# Patient Record
Sex: Male | Born: 1995 | Race: Asian | Hispanic: No | Marital: Single | Smoking: Former smoker
Health system: Southern US, Community
[De-identification: ages and names within clinical notes are randomized; demographics above are authoritative.]

## PROBLEM LIST (undated history)

## (undated) DIAGNOSIS — I099 Rheumatic heart disease, unspecified: Secondary | ICD-10-CM

## (undated) DIAGNOSIS — Z952 Presence of prosthetic heart valve: Secondary | ICD-10-CM

## (undated) HISTORY — DX: Presence of prosthetic heart valve: Z95.2

## (undated) HISTORY — DX: Rheumatic heart disease, unspecified: I09.9

---
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
## 2014-06-28 ENCOUNTER — Ambulatory Visit
Admission: RE | Admit: 2014-06-28 | Discharge: 2014-06-28 | Disposition: A | Discharge status: Home / Self Care | Payer: Medicaid Other | Source: Ambulatory Visit | Attending: Pediatrics | Admitting: Pediatrics

## 2014-06-28 ENCOUNTER — Other Ambulatory Visit: Payer: Self-pay | Admitting: Pediatrics

## 2014-06-28 DIAGNOSIS — M549 Dorsalgia, unspecified: Secondary | ICD-10-CM

## 2015-01-04 IMAGING — CR DG THORACIC SPINE 3V
3 series · 3 of 3 positions shown · non-contrast
Comparison: 04/10/2012

CLINICAL DATA: Non trauma, initial encounter for pain. Acute and
intermittent pain in the upper and mid thoracic spine for 2 weeks.
Patient occasionally lobes waves.

EXAM:
THORACIC SPINE - 2 VIEW + SWIMMERS

[view not recorded (1 of 3)]
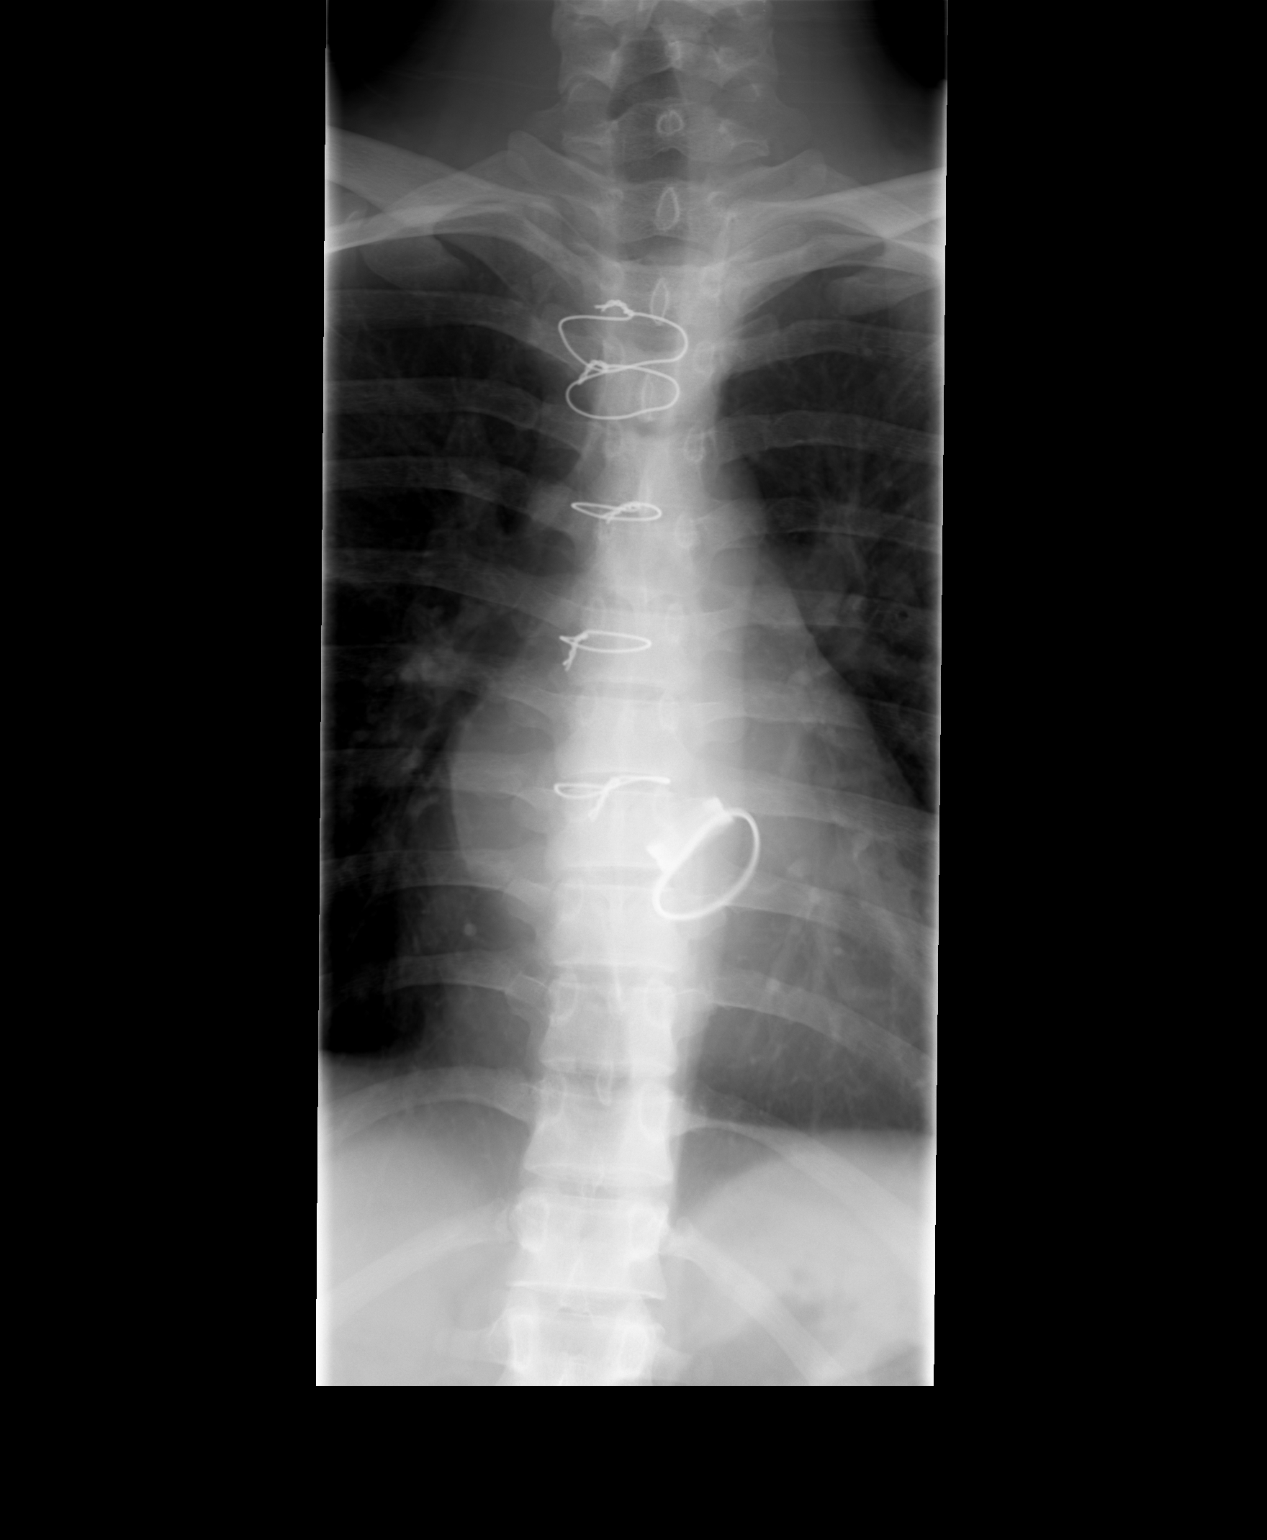

[view not recorded (2 of 3)]
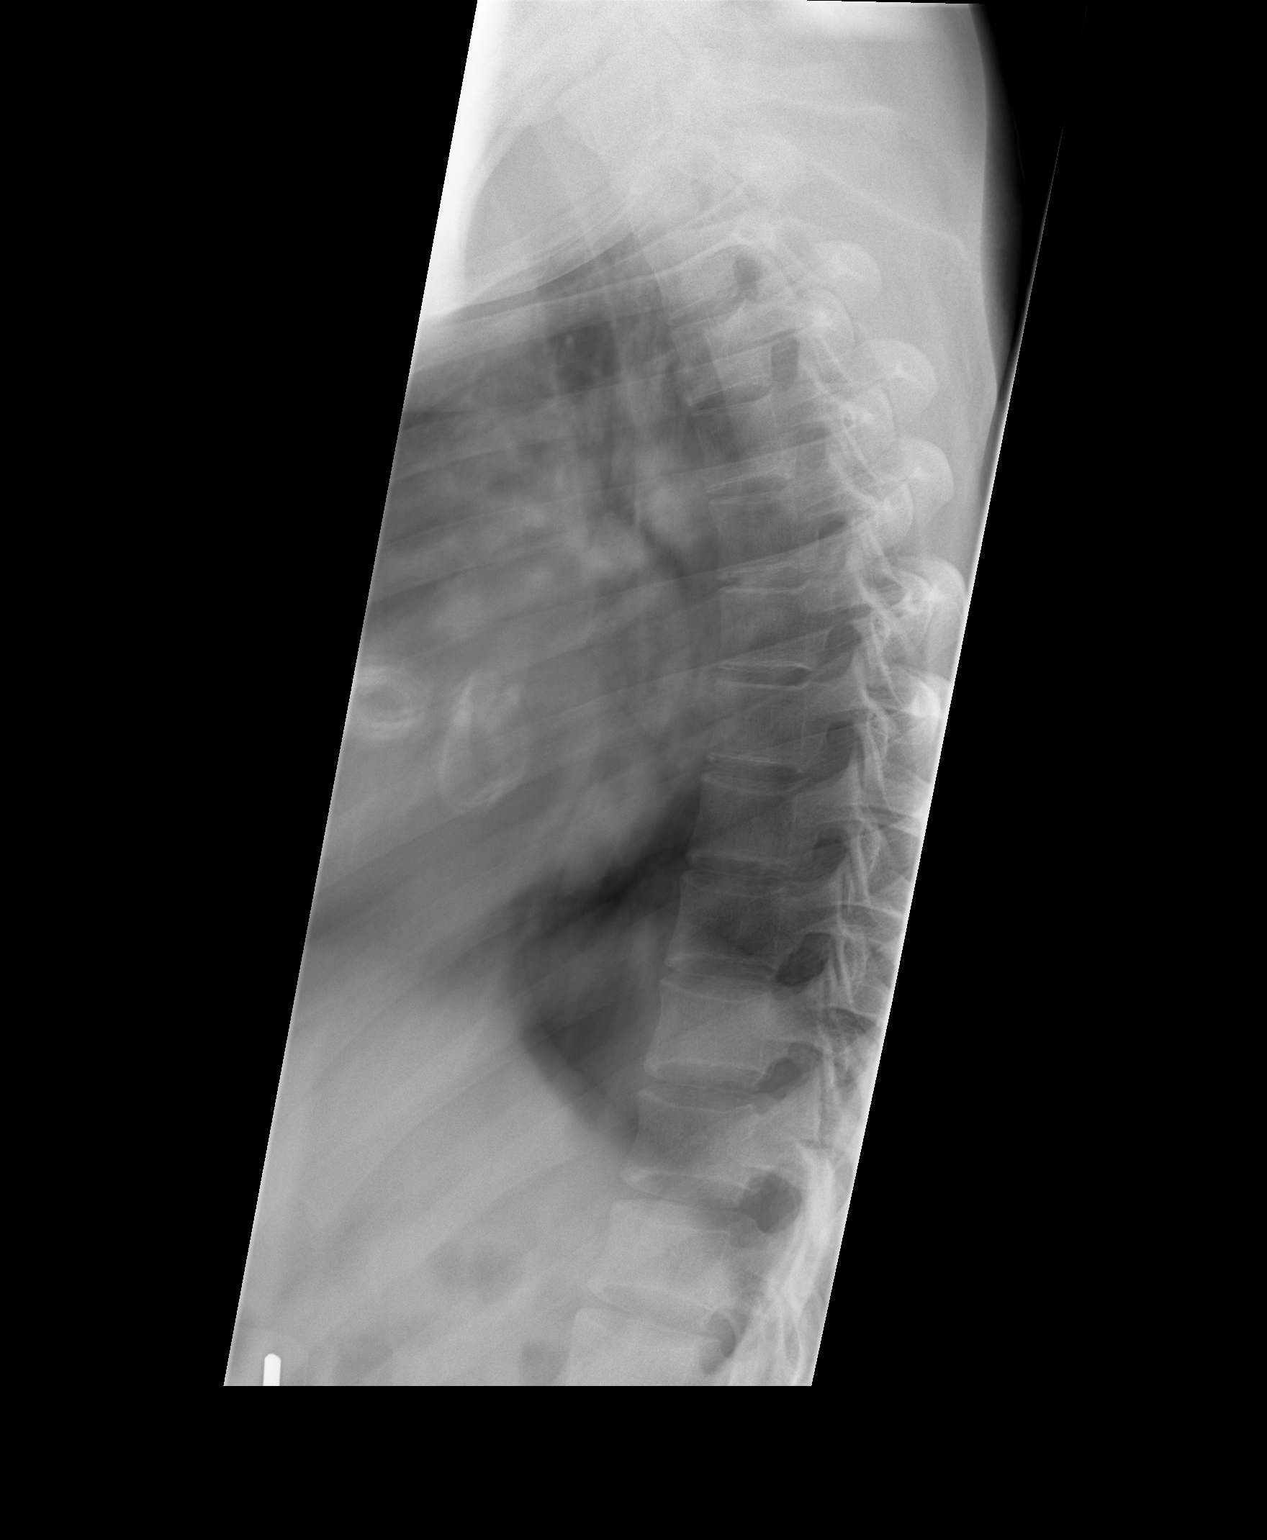

[view not recorded (3 of 3)]
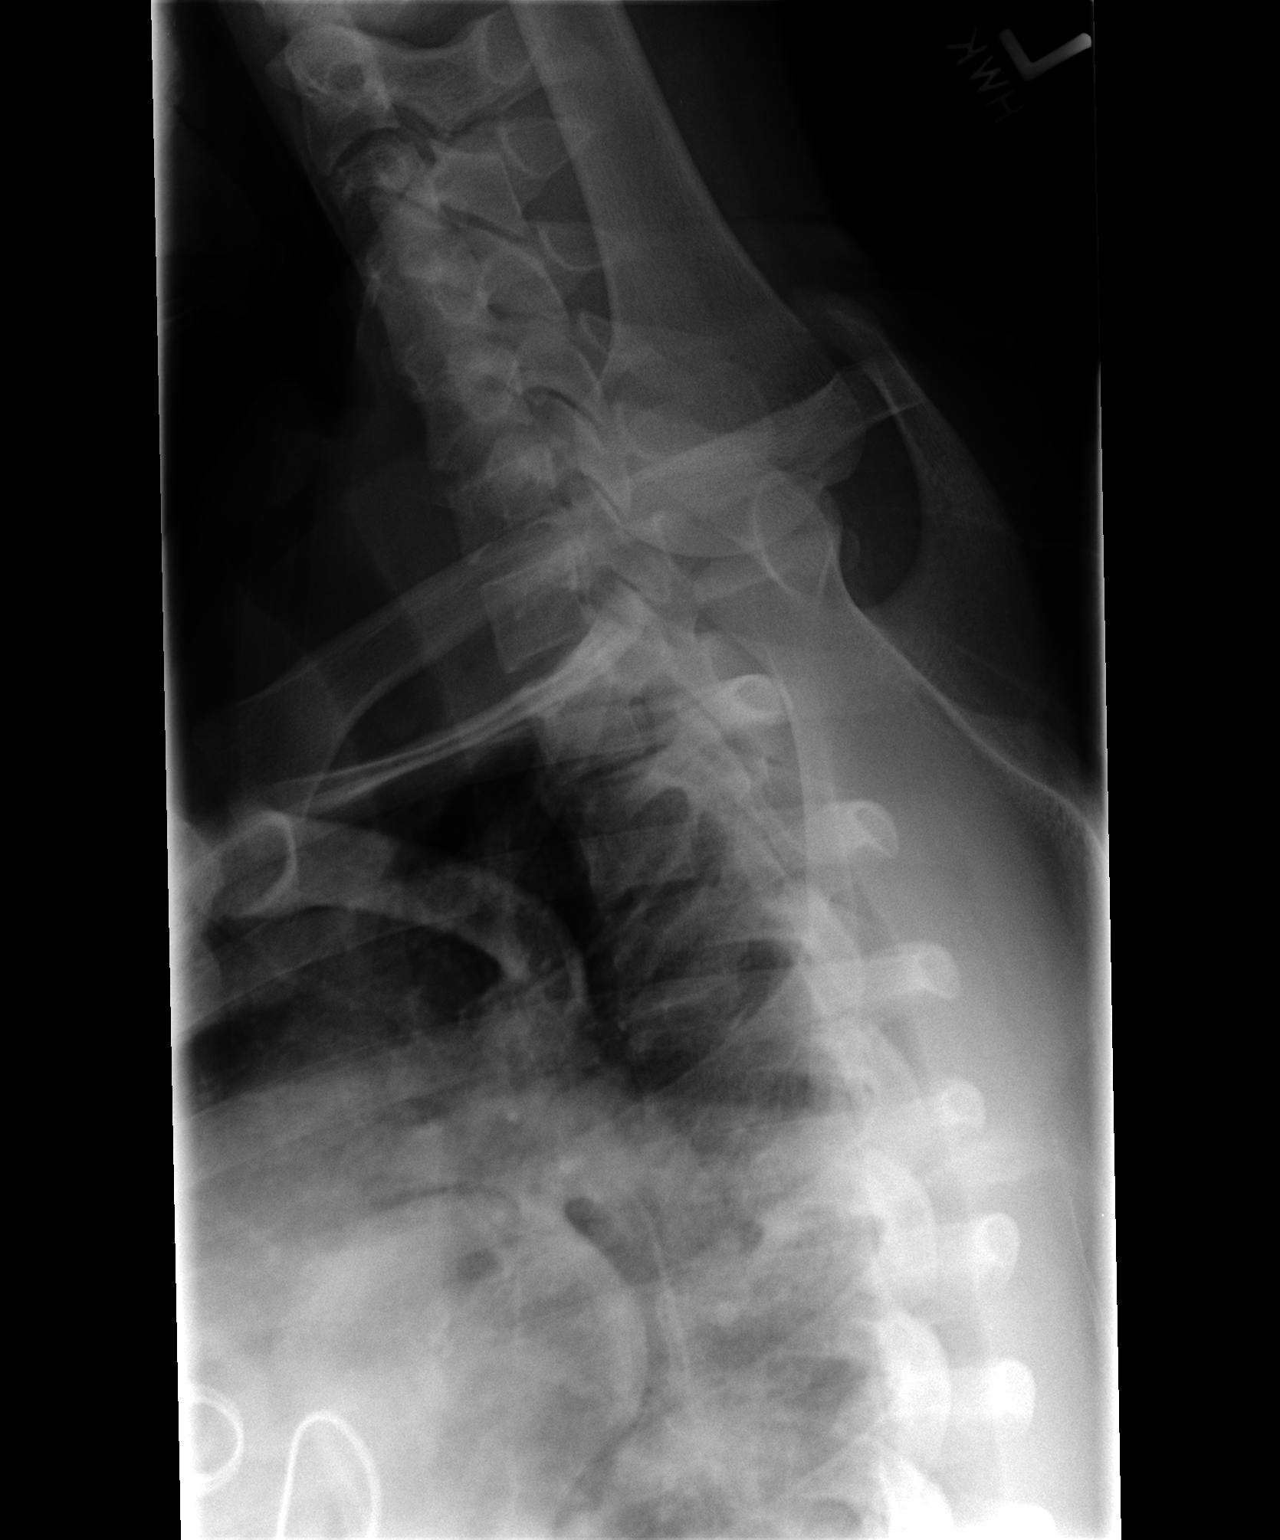

[3 of 3 positions shown; findings below may reference images not displayed]

FINDINGS: Patient is status post median sternotomy and valve replacement. No
acute bony abnormality. Specifically, no fracture or malalignment.
No significant degenerative disease.
IMPRESSION: No acute bony abnormality.

## 2015-07-12 ENCOUNTER — Other Ambulatory Visit (INDEPENDENT_AMBULATORY_CARE_PROVIDER_SITE_OTHER): Payer: Self-pay

## 2015-07-12 DIAGNOSIS — Z7901 Long term (current) use of anticoagulants: Secondary | ICD-10-CM

## 2015-07-13 LAB — PROTIME-INR
INR: 2.8 — ABNORMAL HIGH (ref 0.8–1.2)
Prothrombin Time: 28.6 s — ABNORMAL HIGH (ref 9.1–12.0)

## 2015-08-19 ENCOUNTER — Telehealth: Payer: Self-pay | Admitting: Internal Medicine

## 2015-08-19 MED ORDER — WARFARIN SODIUM 1 MG PO TABS: ORAL_TABLET | ORAL | Status: DC

## 2015-08-19 MED ORDER — WARFARIN SODIUM 5 MG PO TABS: ORAL_TABLET | ORAL | Status: DC

## 2015-08-19 MED ORDER — LISINOPRIL 5 MG PO TABS: 5.0000 mg | ORAL_TABLET | Freq: Every day | ORAL | Status: DC

## 2015-08-19 NOTE — Telephone Encounter (Signed)
Received message from Hilton HotelsPastor Nie.  He is working on reapplication for [employer] for [employer].  In meantime, he needs prescriptions sent to [facility] as [facility] the orange card.  [facility]  He comes in next week for INR

## 2015-08-23 ENCOUNTER — Other Ambulatory Visit (INDEPENDENT_AMBULATORY_CARE_PROVIDER_SITE_OTHER): Payer: Self-pay

## 2015-08-23 DIAGNOSIS — Z7901 Long term (current) use of anticoagulants: Secondary | ICD-10-CM

## 2015-08-24 LAB — PROTIME-INR
INR: 5.8 — ABNORMAL HIGH (ref 0.8–1.2)
PROTHROMBIN TIME: 57.9 s — AB (ref 9.1–12.0)

## 2015-09-06 ENCOUNTER — Ambulatory Visit: Payer: Self-pay

## 2015-09-06 DIAGNOSIS — Z7901 Long term (current) use of anticoagulants: Secondary | ICD-10-CM

## 2015-09-07 LAB — PROTIME-INR
INR: 2.5 — AB (ref 0.8–1.2)
PROTHROMBIN TIME: 25.7 s — AB (ref 9.1–12.0)

## 2015-09-27 ENCOUNTER — Other Ambulatory Visit (INDEPENDENT_AMBULATORY_CARE_PROVIDER_SITE_OTHER): Payer: Self-pay

## 2015-09-27 DIAGNOSIS — Z7901 Long term (current) use of anticoagulants: Secondary | ICD-10-CM

## 2015-09-28 LAB — PROTIME-INR
INR: 2.8 — ABNORMAL HIGH (ref 0.8–1.2)
PROTHROMBIN TIME: 28 s — AB (ref 9.1–12.0)

## 2015-11-01 ENCOUNTER — Other Ambulatory Visit (INDEPENDENT_AMBULATORY_CARE_PROVIDER_SITE_OTHER): Payer: Self-pay

## 2015-11-01 DIAGNOSIS — Z7901 Long term (current) use of anticoagulants: Secondary | ICD-10-CM

## 2015-11-02 LAB — PROTIME-INR
INR: 3.6 — AB (ref 0.8–1.2)
PROTHROMBIN TIME: 36.9 s — AB (ref 9.1–12.0)

## 2015-11-18 ENCOUNTER — Other Ambulatory Visit: Payer: Self-pay

## 2015-11-21 ENCOUNTER — Other Ambulatory Visit (INDEPENDENT_AMBULATORY_CARE_PROVIDER_SITE_OTHER): Payer: Self-pay

## 2015-11-21 DIAGNOSIS — Z7901 Long term (current) use of anticoagulants: Secondary | ICD-10-CM

## 2015-11-22 LAB — PROTIME-INR
INR: 2.8 — ABNORMAL HIGH (ref 0.8–1.2)
Prothrombin Time: 28 s — ABNORMAL HIGH (ref 9.1–12.0)

## 2016-01-02 ENCOUNTER — Ambulatory Visit: Payer: Self-pay | Admitting: Internal Medicine

## 2016-01-04 ENCOUNTER — Other Ambulatory Visit (INDEPENDENT_AMBULATORY_CARE_PROVIDER_SITE_OTHER): Payer: Self-pay

## 2016-01-04 DIAGNOSIS — Z952 Presence of prosthetic heart valve: Secondary | ICD-10-CM

## 2016-01-04 DIAGNOSIS — Z7901 Long term (current) use of anticoagulants: Secondary | ICD-10-CM

## 2016-01-04 DIAGNOSIS — Z79899 Other long term (current) drug therapy: Secondary | ICD-10-CM

## 2016-01-04 DIAGNOSIS — Z954 Presence of other heart-valve replacement: Secondary | ICD-10-CM

## 2016-01-05 LAB — PROTIME-INR
INR: 3.3 — ABNORMAL HIGH (ref 0.8–1.2)
PROTHROMBIN TIME: 33.2 s — AB (ref 9.1–12.0)

## 2016-01-17 ENCOUNTER — Encounter: Payer: Self-pay | Admitting: Internal Medicine

## 2016-01-17 ENCOUNTER — Ambulatory Visit (INDEPENDENT_AMBULATORY_CARE_PROVIDER_SITE_OTHER): Payer: Self-pay | Admitting: Internal Medicine

## 2016-01-17 VITALS — BP 122/62 | HR 96 | Resp 18 | Ht 63.5 in | Wt 133.0 lb

## 2016-01-17 DIAGNOSIS — H5210 Myopia, unspecified eye: Secondary | ICD-10-CM | POA: Insufficient documentation

## 2016-01-17 DIAGNOSIS — Z8679 Personal history of other diseases of the circulatory system: Secondary | ICD-10-CM | POA: Insufficient documentation

## 2016-01-17 DIAGNOSIS — H5213 Myopia, bilateral: Secondary | ICD-10-CM

## 2016-01-17 DIAGNOSIS — Z9889 Other specified postprocedural states: Secondary | ICD-10-CM

## 2016-01-17 DIAGNOSIS — Z952 Presence of prosthetic heart valve: Secondary | ICD-10-CM

## 2016-01-17 DIAGNOSIS — N4889 Other specified disorders of penis: Secondary | ICD-10-CM

## 2016-01-17 DIAGNOSIS — I517 Cardiomegaly: Secondary | ICD-10-CM

## 2016-01-17 NOTE — Progress Notes (Signed)
Subjective:    Patient ID: Ricky James, male    DOB: 1996-05-10, 20 y.o.   MRN: [id number]  HPI   1.  History of Rheumatic Heart Disease:  Pt. Only seen here once over 1 year ago, though has been getting Protime/INRs monthly.   Pt. Remembers not having many symptoms until age 46 (2011), when he began vomiting and coughing.  Reportedly underwent AVR, sounds like underwent Mitral Valvuloplasty and Ring Annuloplasty at same time in [facility] [place], [place]. [place].   Came to [place]. In 2012.  Underwent Echo with Dr. Dalene Seltzer at [facility] cardiology about 6 months after arrival. Has been on chronic anticoagulation with Warfarin.  Has been fairly consistently in therapeutic range of INR 2.5 to 3.5 in past year.  Takes Warfarin in evening when home from work and after eating evening meal. Has appointment possibly next week with Dr. Elizebeth Brooking.  Sees Dr. Elizebeth Brooking every 6 months. Has mild perivalvular aortic leak, Mildly increasing mitral valve gradient over time, and LAE, felt to be residual from Mitral stenosis before surgery.   Apparently, plan is to revise Mitral valve in the future with ? Replacement possibly, but waiting for when really need to do so. Taking Lisinopril 5 mg most days, but admits he forgets on occasion.  Denies chest pain, dyspnea, palpitations, light headedness.  2.  Myopia:  Needs eye appt. As lost glasses.  Does have Medicaid now.  [facility] previously.  3.  Pain in penis:  Does not note pain with urination.  Is sexually active with a male.  Same partner for 2 years.  However, when was recently in [place], did have intercourse more than once with another male.  She was an old classmate of his.  He is unaware of her having another partner or vaginal discharge/STD symptoms.   He denies penile discharge.   Has noted red lesion on the tip of his penis-first noted last week--this is where the pain comes from. No rash other areas of body, no testicular discomfort.   Current  outpatient prescriptions:  .  lisinopril (PRINIVIL,ZESTRIL) 5 MG tablet, Take 1 tablet (5 mg total) by mouth daily., Disp: 30 tablet, Rfl: 11 .  warfarin (COUMADIN) 1 MG tablet, 4 tabs by mouth once daily on Saturdays and Sundays, Disp: 32 tablet, Rfl: 11 .  warfarin (COUMADIN) 5 MG tablet, Take 1 tab by mouth daily Monday through Friday., Disp: 30 tablet, Rfl: 11   Though patient was taking Warfarin 4 mg Friday through Sunday, but he is actually taking 5 mg through Friday and only taking 4 mg on Sa, Sun.    No Known Allergies     Review of Systems     Objective:   Physical Exam NAD HEENT:  PERRL, EOMI, Neck:  Supple, No adenopathy Chest:  CTA CV:  RRR with normal S1 and S2, Grade III/VI SEM LSB radiating to right second interspace.  Do not hear a diastolic murmur. Carotid, Radial, DP pulses normodynamic and equal. Abd:  S, NT, No HSM or mass, +BS. LE:  No edema GU:  Small serpiginous area of erythema on dorsal aspect of glans--entire area is 2 mm by 5 mm.  Possibly one other small spot of erythema right dorsal penis, just behind glans.  Uncircumsized and foreskin without lesion. Shaft nontender and no penile discharge.  Testicle descended bilaterally and without mass or tenderness. No inguinal adenopathy or tenderness.       Assessment & Plan:  1.  History of Rheumatic Heart Disease with Mechanical aortic valve replacement and Mitral annuloplasty/valvuloplasty in 2011 at age 20 yo. Followed by Dr. Dalene SeltzerJohn Cotton, [facility]. Continue anticoagulation with Warfarin--due in a bit over 2 weeks. Continue Lisinopril--encouraged not to miss.  2.  Myopia:  [facility] and glasses  3.  Penile lesion/small rash after intercourse last month with new partner.  Urethral swab for GC and Chlamydia obtained, Check RPR, HIV.  To call if developed discharge for wet prep.  No intercourse with partner here. Not clear if STD or other--such a small  lesion.

## 2016-01-18 LAB — GC/CHLAMYDIA PROBE AMP
CHLAMYDIA, DNA PROBE: NEGATIVE
Neisseria gonorrhoeae by PCR: NEGATIVE

## 2016-01-18 LAB — HIV ANTIBODY (ROUTINE TESTING W REFLEX): HIV SCREEN 4TH GENERATION: NONREACTIVE

## 2016-01-18 LAB — RPR QUALITATIVE: RPR: NONREACTIVE

## 2016-01-31 ENCOUNTER — Other Ambulatory Visit: Payer: Self-pay

## 2016-01-31 DIAGNOSIS — Z952 Presence of prosthetic heart valve: Secondary | ICD-10-CM

## 2016-02-01 LAB — PROTIME-INR
INR: 3 — AB (ref 0.8–1.2)
PROTHROMBIN TIME: 30.6 s — AB (ref 9.1–12.0)

## 2016-02-07 ENCOUNTER — Other Ambulatory Visit: Payer: PRIVATE HEALTH INSURANCE

## 2016-02-15 DIAGNOSIS — Z719 Counseling, unspecified: Secondary | ICD-10-CM

## 2016-02-15 NOTE — Congregational Nurse Program (Signed)
Congregational Nurse Program Note  Date of Encounter: 02/15/2016  Past Medical History: Past Medical History  Diagnosis Date  . Rheumatic heart disease 2012--1st echo in [place]    [place]ortic Valve replacement in [place].  Later, underwent mitral valvuloplaty/annuloplasty.  Has perivaluvular aortic valve leak, mild, and gradually increasing Mitral valve gradient.  [facility], Dalene SeltzerJohn Cotton, M.D., [facility]    Encounter Details:     CNP Questionnaire - 02/15/16 1709    Patient Demographics   Is this a new or existing patient? New   Patient is considered a/an Immigrant   Race Asian   Patient Assistance   Location of Patient Assistance Not Applicable   Patient's financial/insurance status [employer] officer, trade unionCard/Care Connects;Low Income;Medicaid  Family Planning only Medicaid   Uninsured Patient No   Patient referred to apply for the following financial assistance [employer] Card/Care Connects Renewal   Food insecurities addressed Not Applicable   Transportation assistance No   Assistance securing medications No   Educational health offerings Not Applicable   Encounter Details   Primary purpose of visit Other   Was an Emergency Department visit averted? Not Applicable   Does patient have a medical provider? Yes   Patient referred to Not Applicable   Was a mental health screening completed? (GAINS tool) No   Does patient have dental issues? No   Does patient have vision issues? No   Does your patient have an abnormal blood pressure today? No   Since previous encounter, have you referred patient for abnormal blood pressure that resulted in a new diagnosis or medication change? No   Does your patient have an abnormal blood glucose today? No   Since previous encounter, have you referred patient for abnormal blood glucose that resulted in a new diagnosis or medication change? No   Was there a life-saving intervention made? No       Patient came to CN office regarding Medicaid  coverage.  States one of his doctors told him he has full Medicaid coverage and another said he only has [employer] Medicaid.  Phone call to [facility] Theressa Millardshley Mitchell [phone]).  She stated he has Family Planning only because he has aged out of full Medicaid coverage.  Patient advised to renew his [employer] Card.  Brantley Flingarolyn O'Brien RN, Congregational Nurse [phone].

## 2016-02-28 ENCOUNTER — Other Ambulatory Visit (INDEPENDENT_AMBULATORY_CARE_PROVIDER_SITE_OTHER): Payer: Self-pay

## 2016-02-28 DIAGNOSIS — Z7901 Long term (current) use of anticoagulants: Secondary | ICD-10-CM

## 2016-02-28 DIAGNOSIS — Z5181 Encounter for therapeutic drug level monitoring: Secondary | ICD-10-CM

## 2016-02-28 DIAGNOSIS — Z954 Presence of other heart-valve replacement: Secondary | ICD-10-CM

## 2016-02-28 DIAGNOSIS — Z952 Presence of prosthetic heart valve: Secondary | ICD-10-CM

## 2016-02-29 LAB — PROTIME-INR
INR: 3.9 — AB (ref 0.8–1.2)
PROTHROMBIN TIME: 39.4 s — AB (ref 9.1–12.0)

## 2016-03-06 ENCOUNTER — Other Ambulatory Visit: Payer: PRIVATE HEALTH INSURANCE

## 2016-03-14 ENCOUNTER — Other Ambulatory Visit (INDEPENDENT_AMBULATORY_CARE_PROVIDER_SITE_OTHER): Payer: Self-pay

## 2016-03-14 DIAGNOSIS — Z7901 Long term (current) use of anticoagulants: Secondary | ICD-10-CM

## 2016-03-14 DIAGNOSIS — Z8679 Personal history of other diseases of the circulatory system: Secondary | ICD-10-CM

## 2016-03-15 LAB — PROTIME-INR
INR: 2 — ABNORMAL HIGH (ref 0.8–1.2)
Prothrombin Time: 20 s — ABNORMAL HIGH (ref 9.1–12.0)

## 2016-04-09 ENCOUNTER — Other Ambulatory Visit: Payer: Self-pay

## 2016-04-16 ENCOUNTER — Other Ambulatory Visit (INDEPENDENT_AMBULATORY_CARE_PROVIDER_SITE_OTHER): Payer: Self-pay

## 2016-04-16 DIAGNOSIS — Z7901 Long term (current) use of anticoagulants: Secondary | ICD-10-CM

## 2016-04-16 DIAGNOSIS — Z8679 Personal history of other diseases of the circulatory system: Secondary | ICD-10-CM

## 2016-04-17 LAB — PROTIME-INR
INR: 3.6 — ABNORMAL HIGH (ref 0.8–1.2)
Prothrombin Time: 36.3 s — ABNORMAL HIGH (ref 9.1–12.0)

## 2016-05-14 ENCOUNTER — Other Ambulatory Visit (INDEPENDENT_AMBULATORY_CARE_PROVIDER_SITE_OTHER): Payer: Self-pay

## 2016-05-14 DIAGNOSIS — Z8679 Personal history of other diseases of the circulatory system: Secondary | ICD-10-CM

## 2016-05-14 DIAGNOSIS — Z7901 Long term (current) use of anticoagulants: Secondary | ICD-10-CM

## 2016-05-15 LAB — PROTIME-INR
INR: 2.9 — ABNORMAL HIGH (ref 0.8–1.2)
Prothrombin Time: 29 s — ABNORMAL HIGH (ref 9.1–12.0)

## 2016-06-11 ENCOUNTER — Other Ambulatory Visit: Payer: Self-pay

## 2016-06-15 ENCOUNTER — Other Ambulatory Visit: Payer: Self-pay

## 2016-06-18 ENCOUNTER — Other Ambulatory Visit (INDEPENDENT_AMBULATORY_CARE_PROVIDER_SITE_OTHER): Payer: Self-pay

## 2016-06-18 DIAGNOSIS — Z79899 Other long term (current) drug therapy: Secondary | ICD-10-CM

## 2016-06-19 LAB — PROTIME-INR
INR: 2.9 — ABNORMAL HIGH (ref 0.8–1.2)
Prothrombin Time: 28.4 s — ABNORMAL HIGH (ref 9.1–12.0)

## 2016-07-20 ENCOUNTER — Other Ambulatory Visit: Payer: Self-pay | Admitting: Internal Medicine

## 2016-07-23 ENCOUNTER — Other Ambulatory Visit: Payer: Self-pay | Admitting: Internal Medicine

## 2016-07-23 ENCOUNTER — Ambulatory Visit: Payer: PRIVATE HEALTH INSURANCE | Admitting: Internal Medicine

## 2016-07-25 ENCOUNTER — Other Ambulatory Visit (INDEPENDENT_AMBULATORY_CARE_PROVIDER_SITE_OTHER): Payer: Self-pay | Admitting: Internal Medicine

## 2016-07-25 DIAGNOSIS — Z952 Presence of prosthetic heart valve: Secondary | ICD-10-CM

## 2016-07-25 DIAGNOSIS — Z7901 Long term (current) use of anticoagulants: Secondary | ICD-10-CM

## 2016-07-25 DIAGNOSIS — Z79899 Other long term (current) drug therapy: Secondary | ICD-10-CM

## 2016-07-25 DIAGNOSIS — Z5181 Encounter for therapeutic drug level monitoring: Secondary | ICD-10-CM

## 2016-07-25 NOTE — Progress Notes (Signed)
Here for INR Continues on 5 mg daily Warfarin Monday through Friday and 4 mg Sa, Sun Also taking Lisinopril daily, 5 mg Adding CBC, CMP

## 2016-07-26 LAB — CBC WITH DIFFERENTIAL/PLATELET
BASOS: 1 %
Basophils Absolute: 0 10*3/uL (ref 0.0–0.2)
EOS (ABSOLUTE): 0 10*3/uL (ref 0.0–0.4)
EOS: 0 %
HEMATOCRIT: 35.5 % — AB (ref 37.5–51.0)
HEMOGLOBIN: 10.5 g/dL — AB (ref 12.6–17.7)
Immature Grans (Abs): 0 10*3/uL (ref 0.0–0.1)
Immature Granulocytes: 0 %
LYMPHS ABS: 1.1 10*3/uL (ref 0.7–3.1)
Lymphs: 22 %
MCH: 22.2 pg — ABNORMAL LOW (ref 26.6–33.0)
MCHC: 29.6 g/dL — AB (ref 31.5–35.7)
MCV: 75 fL — AB (ref 79–97)
Monocytes Absolute: 0.9 10*3/uL (ref 0.1–0.9)
Monocytes: 18 %
NEUTROS ABS: 3 10*3/uL (ref 1.4–7.0)
Neutrophils: 59 %
Platelets: 481 10*3/uL — ABNORMAL HIGH (ref 150–379)
RBC: 4.74 x10E6/uL (ref 4.14–5.80)
RDW: 20.4 % — ABNORMAL HIGH (ref 12.3–15.4)
WBC: 5.1 10*3/uL (ref 3.4–10.8)

## 2016-07-26 LAB — COMPREHENSIVE METABOLIC PANEL
ALBUMIN: 5.3 g/dL (ref 3.5–5.5)
ALT: 20 IU/L (ref 0–44)
AST: 47 IU/L — ABNORMAL HIGH (ref 0–40)
Albumin/Globulin Ratio: 1.4 (ref 1.2–2.2)
Alkaline Phosphatase: 58 IU/L (ref 39–117)
BUN / CREAT RATIO: 15 (ref 9–20)
BUN: 11 mg/dL (ref 6–20)
Bilirubin Total: 0.8 mg/dL (ref 0.0–1.2)
CALCIUM: 10 mg/dL (ref 8.7–10.2)
CO2: 27 mmol/L (ref 18–29)
CREATININE: 0.72 mg/dL — AB (ref 0.76–1.27)
Chloride: 96 mmol/L (ref 96–106)
GFR, EST AFRICAN AMERICAN: 154 mL/min/{1.73_m2} (ref >59)
GFR, EST NON AFRICAN AMERICAN: 133 mL/min/{1.73_m2} (ref >59)
GLOBULIN, TOTAL: 3.7 g/dL (ref 1.5–4.5)
GLUCOSE: 72 mg/dL (ref 65–99)
Potassium: 4.7 mmol/L (ref 3.5–5.2)
Sodium: 138 mmol/L (ref 134–144)
TOTAL PROTEIN: 9 g/dL — AB (ref 6.0–8.5)

## 2016-07-26 LAB — PROTIME-INR
INR: 2.3 — ABNORMAL HIGH (ref 0.8–1.2)
PROTHROMBIN TIME: 22.7 s — AB (ref 9.1–12.0)

## 2016-08-01 LAB — IRON AND TIBC
IRON SATURATION: 11 % — AB (ref 15–55)
Iron: 36 ug/dL — ABNORMAL LOW (ref 38–169)
Total Iron Binding Capacity: 335 ug/dL (ref 250–450)
UIBC: 299 ug/dL (ref 111–343)

## 2016-08-01 LAB — SPECIMEN STATUS REPORT

## 2016-08-01 NOTE — Progress Notes (Signed)
Left detailed message. Informed patient he can get Ferrous Gluconate from any pharmacy because it is OTC. Informed will do additional lab when he comes in 1 month.

## 2016-08-15 ENCOUNTER — Encounter: Payer: Self-pay | Admitting: Internal Medicine

## 2016-08-15 ENCOUNTER — Ambulatory Visit (INDEPENDENT_AMBULATORY_CARE_PROVIDER_SITE_OTHER): Payer: Self-pay | Admitting: Internal Medicine

## 2016-08-15 VITALS — BP 102/66 | HR 78 | Resp 12 | Ht 63.0 in | Wt 133.0 lb

## 2016-08-15 DIAGNOSIS — K029 Dental caries, unspecified: Secondary | ICD-10-CM

## 2016-08-15 DIAGNOSIS — Z23 Encounter for immunization: Secondary | ICD-10-CM

## 2016-08-15 DIAGNOSIS — D509 Iron deficiency anemia, unspecified: Secondary | ICD-10-CM

## 2016-08-15 DIAGNOSIS — Z9889 Other specified postprocedural states: Secondary | ICD-10-CM

## 2016-08-15 DIAGNOSIS — Z952 Presence of prosthetic heart valve: Secondary | ICD-10-CM

## 2016-08-15 MED ORDER — FERROUS GLUCONATE 324 (38 FE) MG PO TABS: ORAL_TABLET | ORAL | 3 refills | Status: DC

## 2016-08-15 NOTE — Progress Notes (Signed)
   Subjective:    Patient ID: Ricky James, male    DOB: 30-Aug-1996, 20 y.o.   MRN: [id number]  HPI  1.  Iron Deficiency anemia:  Did not get the Ferrous gluconate as he was told at the pharmacy it was not available.  Microcytic anemia with low iron stores.  No melena or hematochezia.   Not a big vegetable eater, but does eat meat.    2.  Aortic and mitral valvular disease, with history of AVR and mitral valvuloplasty:  Planning for cardiac cath to check cardiac/pulmonary pressures to determine if need to proceed with mitral valve replacement.  Scheduled for December 18th.  Pt. To stop coumadin 3 days before.  Current Meds  Medication Sig  . lisinopril (PRINIVIL,ZESTRIL) 5 MG tablet Take 1 tablet (5 mg total) by mouth daily.  Marland Kitchen. warfarin (COUMADIN) 1 MG tablet 4 tabs by mouth once daily on Saturdays and Sundays  . warfarin (COUMADIN) 5 MG tablet Take 1 tab by mouth daily Monday through Friday.    No Known Allergies   Review of Systems     Objective:   Physical Exam NAD HEENT  Areas of dental decay/cavities, some areas of crowding. Neck:  Supple, no adenopathy Chest: CTA CV:  RRR with occasional extrasystole.  Grade III/VI systolic ejection murmur.? Early soft diastolic murmur.  Radial and DP pulses strong. Abd:  S, NT, No HSM or mass. LE:  No edema       Assessment & Plan:  1.  Iron Deficiency Anemia:  Write out Ferrous gluconate to take 325 mg daily--discussed this is over the counter and should be able to get at any pharmacy.  To call if remains a problem Will recheck CBC not with next protime but with one in January.  2.  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 Elizebeth Brookingotton.  Continue Warfarin until 3 days before cath.  Continue Lisinopril. Influenza vaccine.  3.  Dental Decay:  Dental referral--will have this after his cardiac evaluation.  Discussed importance of regular dental cleaning and care.   Will need SBE prophylaxis with Amoxicillin  2 g by mouth 1 hour prior to cleaning, etc.

## 2016-08-27 ENCOUNTER — Other Ambulatory Visit (INDEPENDENT_AMBULATORY_CARE_PROVIDER_SITE_OTHER): Payer: Self-pay

## 2016-08-27 DIAGNOSIS — Z952 Presence of prosthetic heart valve: Secondary | ICD-10-CM

## 2016-08-28 LAB — PROTIME-INR
INR: 1.7 — ABNORMAL HIGH (ref 0.8–1.2)
Prothrombin Time: 17.5 s — ABNORMAL HIGH (ref 9.1–12.0)

## 2016-08-29 NOTE — Progress Notes (Signed)
Called and no answer and voicemail was full.

## 2016-09-05 NOTE — Progress Notes (Signed)
Spoke with patient. Labs discussed. Lab appointment scheduled for 09/18/16.

## 2016-09-18 ENCOUNTER — Other Ambulatory Visit (INDEPENDENT_AMBULATORY_CARE_PROVIDER_SITE_OTHER): Payer: Self-pay

## 2016-09-18 DIAGNOSIS — D509 Iron deficiency anemia, unspecified: Secondary | ICD-10-CM

## 2016-09-18 DIAGNOSIS — Z952 Presence of prosthetic heart valve: Secondary | ICD-10-CM

## 2016-09-19 LAB — CBC WITH DIFFERENTIAL/PLATELET
BASOS: 1 %
Basophils Absolute: 0 10*3/uL (ref 0.0–0.2)
EOS (ABSOLUTE): 0.1 10*3/uL (ref 0.0–0.4)
EOS: 1 %
HEMATOCRIT: 34.4 % — AB (ref 37.5–51.0)
HEMOGLOBIN: 10.3 g/dL — AB (ref 13.0–17.7)
Immature Grans (Abs): 0 10*3/uL (ref 0.0–0.1)
Immature Granulocytes: 0 %
LYMPHS ABS: 2 10*3/uL (ref 0.7–3.1)
Lymphs: 27 %
MCH: 22.7 pg — ABNORMAL LOW (ref 26.6–33.0)
MCHC: 29.9 g/dL — AB (ref 31.5–35.7)
MCV: 76 fL — AB (ref 79–97)
MONOCYTES: 7 %
Monocytes Absolute: 0.5 10*3/uL (ref 0.1–0.9)
NEUTROS ABS: 4.6 10*3/uL (ref 1.4–7.0)
Neutrophils: 64 %
Platelets: 416 10*3/uL — ABNORMAL HIGH (ref 150–379)
RBC: 4.53 x10E6/uL (ref 4.14–5.80)
RDW: 19.9 % — ABNORMAL HIGH (ref 12.3–15.4)
WBC: 7.2 10*3/uL (ref 3.4–10.8)

## 2016-09-19 LAB — PROTIME-INR
INR: 2.8 — ABNORMAL HIGH (ref 0.8–1.2)
Prothrombin Time: 27.8 s — ABNORMAL HIGH (ref 9.1–12.0)

## 2016-09-19 NOTE — Progress Notes (Signed)
Spoke with patient. Lab results given.

## 2016-09-24 ENCOUNTER — Other Ambulatory Visit: Payer: Self-pay

## 2016-10-08 ENCOUNTER — Other Ambulatory Visit: Payer: Self-pay | Admitting: Internal Medicine

## 2016-10-08 ENCOUNTER — Other Ambulatory Visit (INDEPENDENT_AMBULATORY_CARE_PROVIDER_SITE_OTHER): Payer: Self-pay

## 2016-10-08 DIAGNOSIS — Z952 Presence of prosthetic heart valve: Secondary | ICD-10-CM

## 2016-10-09 LAB — PROTIME-INR
INR: 2.9 — AB (ref 0.8–1.2)
Prothrombin Time: 29 s — ABNORMAL HIGH (ref 9.1–12.0)

## 2016-10-09 NOTE — Progress Notes (Signed)
Spoke with patient. Lab results given. Informed to continue same dose of medication. Patient understood.

## 2016-10-12 ENCOUNTER — Other Ambulatory Visit: Payer: Self-pay | Admitting: Internal Medicine

## 2016-10-22 ENCOUNTER — Other Ambulatory Visit: Payer: Self-pay

## 2016-10-30 DIAGNOSIS — Z952 Presence of prosthetic heart valve: Secondary | ICD-10-CM

## 2016-10-30 HISTORY — DX: Presence of prosthetic heart valve: Z95.2

## 2016-10-30 HISTORY — PX: MITRAL VALVE REPLACEMENT: SHX147

## 2016-11-05 DIAGNOSIS — Z952 Presence of prosthetic heart valve: Secondary | ICD-10-CM | POA: Insufficient documentation

## 2016-11-06 ENCOUNTER — Other Ambulatory Visit: Payer: Self-pay

## 2016-11-08 ENCOUNTER — Other Ambulatory Visit (INDEPENDENT_AMBULATORY_CARE_PROVIDER_SITE_OTHER): Payer: Self-pay

## 2016-11-08 ENCOUNTER — Other Ambulatory Visit: Payer: Self-pay

## 2016-11-08 DIAGNOSIS — D509 Iron deficiency anemia, unspecified: Secondary | ICD-10-CM

## 2016-11-08 DIAGNOSIS — Z952 Presence of prosthetic heart valve: Secondary | ICD-10-CM

## 2016-11-09 LAB — CBC WITH DIFFERENTIAL/PLATELET
BASOS: 0 %
Basophils Absolute: 0 10*3/uL (ref 0.0–0.2)
EOS (ABSOLUTE): 0.1 10*3/uL (ref 0.0–0.4)
EOS: 1 %
HEMATOCRIT: 32.9 % — AB (ref 37.5–51.0)
Hemoglobin: 9.8 g/dL — ABNORMAL LOW (ref 13.0–17.7)
IMMATURE GRANS (ABS): 0.1 10*3/uL (ref 0.0–0.1)
IMMATURE GRANULOCYTES: 1 %
LYMPHS: 15 %
Lymphocytes Absolute: 1.7 10*3/uL (ref 0.7–3.1)
MCH: 23.6 pg — ABNORMAL LOW (ref 26.6–33.0)
MCHC: 29.8 g/dL — ABNORMAL LOW (ref 31.5–35.7)
MCV: 79 fL (ref 79–97)
MONOCYTES: 7 %
Monocytes Absolute: 0.8 10*3/uL (ref 0.1–0.9)
NEUTROS PCT: 76 %
Neutrophils Absolute: 8.5 10*3/uL — ABNORMAL HIGH (ref 1.4–7.0)
Platelets: 598 10*3/uL — ABNORMAL HIGH (ref 150–379)
RBC: 4.16 x10E6/uL (ref 4.14–5.80)
RDW: 18.3 % — ABNORMAL HIGH (ref 12.3–15.4)
WBC: 11.3 10*3/uL — ABNORMAL HIGH (ref 3.4–10.8)

## 2016-11-09 LAB — PROTIME-INR
INR: 3.7 — ABNORMAL HIGH (ref 0.8–1.2)
Prothrombin Time: 35.9 s — ABNORMAL HIGH (ref 9.1–12.0)

## 2016-11-13 NOTE — Progress Notes (Signed)
Spoke with patient. Lab results discussed.

## 2016-11-23 ENCOUNTER — Other Ambulatory Visit (INDEPENDENT_AMBULATORY_CARE_PROVIDER_SITE_OTHER): Payer: Self-pay

## 2016-11-23 DIAGNOSIS — Z952 Presence of prosthetic heart valve: Secondary | ICD-10-CM

## 2016-11-23 NOTE — Progress Notes (Unsigned)
Patient now on Coumadin 4 mg daily. Patient coumadin was held for one day.

## 2016-11-24 LAB — PROTIME-INR
INR: 1.8 — ABNORMAL HIGH (ref 0.8–1.2)
Prothrombin Time: 18.8 s — ABNORMAL HIGH (ref 9.1–12.0)

## 2016-11-26 NOTE — Progress Notes (Signed)
Called again today as had not heard back from patient regarding my message to call 2 days ago.   Still unable to reach him directly and left message to call

## 2016-11-26 NOTE — Progress Notes (Signed)
Yvonne KendallPastor Nie called office just after I left message.   Discussed Akio should go back to his regular dosing of 5 mg daily M-F and 4 mg on S, S, but to come in on Friday for repeat INR

## 2016-12-03 ENCOUNTER — Other Ambulatory Visit (INDEPENDENT_AMBULATORY_CARE_PROVIDER_SITE_OTHER): Payer: Self-pay

## 2016-12-03 DIAGNOSIS — Z952 Presence of prosthetic heart valve: Secondary | ICD-10-CM

## 2016-12-04 LAB — PROTIME-INR
INR: 5.8 — ABNORMAL HIGH (ref 0.8–1.2)
PROTHROMBIN TIME: 54.4 s — AB (ref 9.1–12.0)

## 2016-12-05 ENCOUNTER — Telehealth: Payer: Self-pay | Admitting: Internal Medicine

## 2016-12-05 NOTE — Telephone Encounter (Signed)
Spoke with patient. Coming in on Friday at 8:45 am.

## 2016-12-07 ENCOUNTER — Other Ambulatory Visit (INDEPENDENT_AMBULATORY_CARE_PROVIDER_SITE_OTHER): Payer: Self-pay

## 2016-12-07 DIAGNOSIS — Z952 Presence of prosthetic heart valve: Secondary | ICD-10-CM

## 2016-12-08 LAB — PROTIME-INR
INR: 5.9 — ABNORMAL HIGH (ref 0.8–1.2)
Prothrombin Time: 55.1 s — ABNORMAL HIGH (ref 9.1–12.0)

## 2016-12-13 ENCOUNTER — Other Ambulatory Visit (INDEPENDENT_AMBULATORY_CARE_PROVIDER_SITE_OTHER): Payer: Self-pay

## 2016-12-13 DIAGNOSIS — Z952 Presence of prosthetic heart valve: Secondary | ICD-10-CM

## 2016-12-14 LAB — PROTIME-INR
INR: 3.3 — ABNORMAL HIGH (ref 0.8–1.2)
Prothrombin Time: 32.2 s — ABNORMAL HIGH (ref 9.1–12.0)

## 2016-12-20 ENCOUNTER — Other Ambulatory Visit (INDEPENDENT_AMBULATORY_CARE_PROVIDER_SITE_OTHER): Payer: Self-pay

## 2016-12-20 DIAGNOSIS — Z952 Presence of prosthetic heart valve: Secondary | ICD-10-CM

## 2016-12-21 LAB — PROTIME-INR
INR: 3.6 — AB (ref 0.8–1.2)
PROTHROMBIN TIME: 34.8 s — AB (ref 9.1–12.0)

## 2017-01-01 ENCOUNTER — Other Ambulatory Visit (INDEPENDENT_AMBULATORY_CARE_PROVIDER_SITE_OTHER): Payer: Self-pay

## 2017-01-01 ENCOUNTER — Other Ambulatory Visit: Payer: Self-pay | Admitting: Internal Medicine

## 2017-01-02 LAB — PROTIME-INR
INR: 3.8 — ABNORMAL HIGH (ref 0.8–1.2)
PROTHROMBIN TIME: 36.9 s — AB (ref 9.1–12.0)

## 2017-01-29 ENCOUNTER — Other Ambulatory Visit (INDEPENDENT_AMBULATORY_CARE_PROVIDER_SITE_OTHER): Payer: Self-pay

## 2017-01-29 DIAGNOSIS — Z952 Presence of prosthetic heart valve: Secondary | ICD-10-CM

## 2017-01-30 LAB — PROTIME-INR
INR: 1.1 (ref 0.8–1.2)
PROTHROMBIN TIME: 11.9 s (ref 9.1–12.0)

## 2017-02-07 ENCOUNTER — Other Ambulatory Visit (INDEPENDENT_AMBULATORY_CARE_PROVIDER_SITE_OTHER): Payer: Self-pay

## 2017-02-07 DIAGNOSIS — Z952 Presence of prosthetic heart valve: Secondary | ICD-10-CM

## 2017-02-08 LAB — PROTIME-INR
INR: 2.3 — ABNORMAL HIGH (ref 0.8–1.2)
Prothrombin Time: 23.3 s — ABNORMAL HIGH (ref 9.1–12.0)

## 2017-02-11 ENCOUNTER — Telehealth: Payer: Self-pay | Admitting: Internal Medicine

## 2017-02-12 ENCOUNTER — Ambulatory Visit (INDEPENDENT_AMBULATORY_CARE_PROVIDER_SITE_OTHER): Payer: Self-pay | Admitting: Internal Medicine

## 2017-02-12 ENCOUNTER — Encounter: Payer: Self-pay | Admitting: Internal Medicine

## 2017-02-12 VITALS — BP 124/82 | HR 80 | Resp 12 | Ht 63.5 in | Wt 127.0 lb

## 2017-02-12 DIAGNOSIS — D509 Iron deficiency anemia, unspecified: Secondary | ICD-10-CM

## 2017-02-12 DIAGNOSIS — K029 Dental caries, unspecified: Secondary | ICD-10-CM

## 2017-02-12 DIAGNOSIS — Z952 Presence of prosthetic heart valve: Secondary | ICD-10-CM

## 2017-02-12 MED ORDER — WARFARIN SODIUM 4 MG PO TABS: ORAL_TABLET | ORAL | 11 refills | Status: AC

## 2017-02-12 MED ORDER — POLYSACCHARIDE IRON COMPLEX 150 MG PO CAPS: 150.0000 mg | ORAL_CAPSULE | Freq: Every day | ORAL | Status: AC

## 2017-02-12 MED ORDER — WARFARIN SODIUM 1 MG PO TABS: ORAL_TABLET | ORAL | 11 refills | Status: AC

## 2017-02-12 MED ORDER — LISINOPRIL 5 MG PO TABS: ORAL_TABLET | ORAL | 9 refills | Status: AC

## 2017-02-12 NOTE — Patient Instructions (Addendum)
Iron-Rich Diet Iron is a mineral that helps your body to produce hemoglobin. Hemoglobin is a protein in your red blood cells that carries oxygen to your body's tissues. Eating too little iron may cause you to feel weak and tired, and it can increase your risk for infection. Eating enough iron is necessary for your body's metabolism, muscle function, and nervous system. Iron is naturally found in many foods. It can also be added to foods or fortified in foods. There are two types of dietary iron:  Heme iron. Heme iron is absorbed by the body more easily than nonheme iron. Heme iron is found in meat, poultry, and fish.  Nonheme iron. Nonheme iron is found in dietary supplements, iron-fortified grains, beans, and vegetables. You may need to follow an iron-rich diet if:  You have been diagnosed with iron deficiency or iron-deficiency anemia.  You have a condition that prevents you from absorbing dietary iron, such as:  Infection in your intestines.  Celiac disease. This involves long-lasting (chronic) inflammation of your intestines.  You do not eat enough iron.  You eat a diet that is high in foods that impair iron absorption.  You have lost a lot of blood.  You have heavy bleeding during your menstrual cycle.  You are pregnant. What is my plan? Your health care provider may help you to determine how much iron you need per day based on your condition. Generally, when a person consumes sufficient amounts of iron in the diet, the following iron needs are met:  Men.  24-56 years old: 11 mg per day.  59-71 years old: 8 mg per day.  Women.  46-49 years old: 15 mg per day.  61-31 years old: 18 mg per day.  Over 8 years old: 8 mg per day.  Pregnant women: 27 mg per day.  Breastfeeding women: 9 mg per day. What do I need to know about an iron-rich diet?  Eat fresh fruits and vegetables that are high in vitamin C along with foods that are high in iron. This will help increase the  amount of iron that your body absorbs from food, especially with foods containing nonheme iron. Foods that are high in vitamin C include oranges, peppers, tomatoes, and mango.  Take iron supplements only as directed by your health care provider. Overdose of iron can be life-threatening. If you were prescribed iron supplements, take them with orange juice or a vitamin C supplement.  Cook foods in pots and pans that are made from iron.  Eat nonheme iron-containing foods alongside foods that are high in heme iron. This helps to improve your iron absorption.  Certain foods and drinks contain compounds that impair iron absorption. Avoid eating these foods in the same meal as iron-rich foods or with iron supplements. These include:  Coffee, black tea, and red wine.  Milk, dairy products, and foods that are high in calcium.  Beans, soybeans, and peas.  Whole grains.  When eating foods that contain both nonheme iron and compounds that impair iron absorption, follow these tips to absorb iron better.  Soak beans overnight before cooking.  Soak whole grains overnight and drain them before using.  Ferment flours before baking, such as using yeast in bread dough. What foods can I eat? Grains  Iron-fortified breakfast cereal. Iron-fortified whole-wheat bread. Enriched rice. Sprouted grains. Vegetables  Spinach. Potatoes with skin. Green peas. Broccoli. Red and green bell peppers. Fermented vegetables. Fruits  Prunes. Raisins. Oranges. Strawberries. Mango. Grapefruit. Meats and Other Protein Sources  Sources  °Beef liver. Oysters. Beef. Shrimp. Turkey. Chicken. Tuna. Sardines. Chickpeas. Nuts. Tofu. °Beverages  °Tomato juice. Fresh orange juice. Prune juice. Hibiscus tea. Fortified instant breakfast shakes. °Condiments  °Tahini. Fermented soy sauce. °Sweets and Desserts  °Black-strap molasses. °Other  °Wheat germ. °The items listed above may not be a complete list of recommended foods or beverages.  Contact your dietitian for more options.  °What foods are not recommended? °Grains  °Whole grains. Bran cereal. Bran flour. Oats. °Vegetables  °Artichokes. Brussels sprouts. Kale. °Fruits  °Blueberries. Raspberries. Strawberries. Figs. °Meats and Other Protein Sources  °Soybeans. Products made from soy protein. °Dairy  °Milk. Cream. Cheese. Yogurt. Cottage cheese. °Beverages  °Coffee. Black tea. Red wine. °Sweets and Desserts  °Cocoa. Chocolate. Ice cream. °Other  °Basil. Oregano. Parsley. °The items listed above may not be a complete list of foods and beverages to avoid. Contact your dietitian for more information.  °This information is not intended to replace advice given to you by your health care provider. Make sure you discuss any questions you have with your health care provider. °Document Released: 04/17/2005 Document Revised: 03/23/2016 Document Reviewed: 03/31/2014 °Elsevier Interactive Patient Education © 2017 Elsevier Inc. ° ° ° °

## 2017-02-12 NOTE — Telephone Encounter (Signed)
Discussed with patient at [facility].  °

## 2017-02-12 NOTE — Progress Notes (Signed)
   Subjective:    Patient ID: Ricky James, male    DOB: 1995-11-25, 21 y.o.   MRN: [id number]  HPI   Here for followup  1.  Anticoagulation:   For  Chronic rheumatic heart disease, AVR with prosthetic valve and mitral valvuloplasty in 2011, [place].  More recently [facility] 10/[facility]:   Problems recently with remaining in therapeutic range with anticoagulation.   Patient admits he has not been sharing when he misses his warfarin. 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 Missed twice the week his INR dropped to normal range. Discussed possible poor outcome if does not remain in therapeutic range.  Gets up at 7 a.m. Most mornings and takes his Lisinopril.  That is fairly consistent.  His evenings are not. He most recently was taking Warfarin 4 mg daily and not the 5 mg we thought.  He also misses maybe twice weekly.  2.  Iron Deficiency Anemia:  States the F,errous gluconate made him nauseated and is unable to take.   Discussed Iron rich foods.  3.  Dental Decay:  Has not seen dentist yet.  Was waiting until done with surgery, which he is.  Will need SBE prophylaxis Amoxicillin 2 g 1 hour prior to dental procedure and will need to check with dental clinic about when to stop Warfarin.  Current Meds  Medication Sig  . lisinopril (PRINIVIL,ZESTRIL) 5 MG tablet TAKE ONE TABLET BY MOUTH ONCE DAILY  . warfarin (COUMADIN) 4 MG tablet Take 4 mg by mouth daily.  Actually taking Lisinopril 2.5 mg daily  No Known Allergies  Review of Systems     Objective:   Physical Exam  NAD HEENT:  Scattered cavities Neck:  Supple, No adenopathy Chest:  CTA CV:  RRR without murmur or rub.  Crisp mechanical valve noise, Radial and DP pulses normal and equal. LE:  No edema Skin:  Sleeve tattoo, right arm extending onto neck.  Small tattoo inside of left ankle        Assessment & Plan:  1.  Anticoagulation:  Patient has not  shared what he is actually doing.  He will take his Warfarin in the morning when his day is more regimented.  INR today and will follow up based on result.  2.  Anemia:  Gave list of iron rich foods.  He is to pick up Niferex 150 mg daily and take daily.  If does not tolerate, to let us know.  3.  Dental care and decay:  Referral to Dental Clinic:  Will need SBE and at times to stop anticoagulation.

## 2017-02-13 LAB — CBC WITH DIFFERENTIAL/PLATELET
BASOS ABS: 0 10*3/uL (ref 0.0–0.2)
Basos: 0 %
EOS (ABSOLUTE): 0.1 10*3/uL (ref 0.0–0.4)
Eos: 1 %
HEMOGLOBIN: 12.9 g/dL — AB (ref 13.0–17.7)
Hematocrit: 41.2 % (ref 37.5–51.0)
IMMATURE GRANS (ABS): 0 10*3/uL (ref 0.0–0.1)
IMMATURE GRANULOCYTES: 0 %
LYMPHS: 26 %
Lymphocytes Absolute: 1.9 10*3/uL (ref 0.7–3.1)
MCH: 24.4 pg — ABNORMAL LOW (ref 26.6–33.0)
MCHC: 31.3 g/dL — ABNORMAL LOW (ref 31.5–35.7)
MCV: 78 fL — ABNORMAL LOW (ref 79–97)
MONOCYTES: 8 %
Monocytes Absolute: 0.6 10*3/uL (ref 0.1–0.9)
Neutrophils Absolute: 4.8 10*3/uL (ref 1.4–7.0)
Neutrophils: 65 %
Platelets: 322 10*3/uL (ref 150–379)
RBC: 5.28 x10E6/uL (ref 4.14–5.80)
RDW: 19.2 % — ABNORMAL HIGH (ref 12.3–15.4)
WBC: 7.4 10*3/uL (ref 3.4–10.8)

## 2017-02-13 LAB — PROTIME-INR
INR: 3.5 — AB (ref 0.8–1.2)
PROTHROMBIN TIME: 34.2 s — AB (ref 9.1–12.0)

## 2017-02-19 ENCOUNTER — Other Ambulatory Visit (INDEPENDENT_AMBULATORY_CARE_PROVIDER_SITE_OTHER): Payer: Self-pay

## 2017-02-19 DIAGNOSIS — Z952 Presence of prosthetic heart valve: Secondary | ICD-10-CM

## 2017-02-20 LAB — PROTIME-INR
INR: 3.4 — ABNORMAL HIGH (ref 0.8–1.2)
PROTHROMBIN TIME: 33.3 s — AB (ref 9.1–12.0)

## 2017-03-26 ENCOUNTER — Other Ambulatory Visit (INDEPENDENT_AMBULATORY_CARE_PROVIDER_SITE_OTHER): Payer: Self-pay

## 2017-03-26 DIAGNOSIS — Z952 Presence of prosthetic heart valve: Secondary | ICD-10-CM

## 2017-03-27 LAB — PROTIME-INR
INR: 2.6 — AB (ref 0.8–1.2)
PROTHROMBIN TIME: 25.7 s — AB (ref 9.1–12.0)

## 2017-04-24 ENCOUNTER — Other Ambulatory Visit (INDEPENDENT_AMBULATORY_CARE_PROVIDER_SITE_OTHER): Payer: Self-pay

## 2017-04-24 DIAGNOSIS — Z952 Presence of prosthetic heart valve: Secondary | ICD-10-CM

## 2017-04-25 LAB — PROTIME-INR
INR: 1.8 — AB (ref 0.8–1.2)
Prothrombin Time: 17.9 s — ABNORMAL HIGH (ref 9.1–12.0)

## 2017-05-08 ENCOUNTER — Other Ambulatory Visit (INDEPENDENT_AMBULATORY_CARE_PROVIDER_SITE_OTHER): Payer: Self-pay

## 2017-05-08 DIAGNOSIS — Z952 Presence of prosthetic heart valve: Secondary | ICD-10-CM

## 2017-05-09 LAB — PROTIME-INR
INR: 3.8 — AB (ref 0.8–1.2)
Prothrombin Time: 36.2 s — ABNORMAL HIGH (ref 9.1–12.0)

## 2017-05-29 ENCOUNTER — Other Ambulatory Visit: Payer: Self-pay

## 2017-06-12 ENCOUNTER — Other Ambulatory Visit (INDEPENDENT_AMBULATORY_CARE_PROVIDER_SITE_OTHER): Payer: Self-pay

## 2017-06-12 DIAGNOSIS — Z952 Presence of prosthetic heart valve: Secondary | ICD-10-CM

## 2017-06-13 LAB — PROTIME-INR
INR: 2.7 — ABNORMAL HIGH (ref 0.8–1.2)
Prothrombin Time: 26.6 s — ABNORMAL HIGH (ref 9.1–12.0)

## 2017-07-10 ENCOUNTER — Other Ambulatory Visit (INDEPENDENT_AMBULATORY_CARE_PROVIDER_SITE_OTHER): Payer: Self-pay

## 2017-07-10 DIAGNOSIS — Z952 Presence of prosthetic heart valve: Secondary | ICD-10-CM

## 2017-07-10 NOTE — Progress Notes (Signed)
Patient taking 5 mg daily. Patient states he only had 1 mg and 5 mg pills. Patient states he needs 4 mg pills.

## 2017-07-11 LAB — PROTIME-INR
INR: 4 — AB (ref 0.8–1.2)
PROTHROMBIN TIME: 38.3 s — AB (ref 9.1–12.0)

## 2017-07-19 ENCOUNTER — Other Ambulatory Visit (INDEPENDENT_AMBULATORY_CARE_PROVIDER_SITE_OTHER): Payer: Self-pay

## 2017-07-19 DIAGNOSIS — Z952 Presence of prosthetic heart valve: Secondary | ICD-10-CM

## 2017-07-20 LAB — PROTIME-INR
INR: 2.3 — AB (ref 0.8–1.2)
PROTHROMBIN TIME: 23 s — AB (ref 9.1–12.0)

## 2017-08-21 ENCOUNTER — Other Ambulatory Visit: Payer: Self-pay

## 2017-08-21 DIAGNOSIS — Z952 Presence of prosthetic heart valve: Secondary | ICD-10-CM

## 2017-08-21 NOTE — Progress Notes (Signed)
Patient states he is taking Coumadin 5 mg 1 daily.

## 2017-08-22 LAB — PROTIME-INR
INR: 4.3 — ABNORMAL HIGH (ref 0.8–1.2)
Prothrombin Time: 41.3 s — ABNORMAL HIGH (ref 9.1–12.0)

## 2017-09-04 ENCOUNTER — Other Ambulatory Visit: Payer: Self-pay

## 2017-09-04 DIAGNOSIS — Z952 Presence of prosthetic heart valve: Secondary | ICD-10-CM

## 2017-09-04 NOTE — Progress Notes (Signed)
Patient taking coumadin 5 mg 1 daily. Patient also states he has been eating his greens

## 2017-09-05 LAB — PROTIME-INR
INR: 2.1 — AB (ref 0.8–1.2)
Prothrombin Time: 20.8 s — ABNORMAL HIGH (ref 9.1–12.0)

## 2017-09-20 ENCOUNTER — Other Ambulatory Visit: Payer: Self-pay

## 2017-09-25 ENCOUNTER — Other Ambulatory Visit: Payer: Self-pay

## 2017-09-25 DIAGNOSIS — Z952 Presence of prosthetic heart valve: Secondary | ICD-10-CM

## 2017-09-25 NOTE — Progress Notes (Signed)
Patient states he is taking coumadin 5 mg 1 daily. Also states he is eating his greens but not like he should be.

## 2017-09-26 LAB — PROTIME-INR
INR: 2 — ABNORMAL HIGH (ref 0.8–1.2)
PROTHROMBIN TIME: 20.3 s — AB (ref 9.1–12.0)

## 2017-10-03 ENCOUNTER — Other Ambulatory Visit: Payer: Self-pay

## 2017-10-03 DIAGNOSIS — Z952 Presence of prosthetic heart valve: Secondary | ICD-10-CM

## 2017-10-03 NOTE — Progress Notes (Signed)
Patient states he is taking 5 mg daily

## 2017-10-04 LAB — PROTIME-INR
INR: 4.6 — ABNORMAL HIGH (ref 0.8–1.2)
PROTHROMBIN TIME: 43.4 s — AB (ref 9.1–12.0)

## 2017-10-09 ENCOUNTER — Other Ambulatory Visit: Payer: Self-pay

## 2017-10-11 ENCOUNTER — Other Ambulatory Visit: Payer: Self-pay

## 2017-10-11 DIAGNOSIS — Z952 Presence of prosthetic heart valve: Secondary | ICD-10-CM

## 2017-10-11 NOTE — Progress Notes (Signed)
Patient states he is taking coumadin 5 mg 1 daily and he states he is eating his greens on a consistent basis. Patient states he is still struggling with taking medication at the same time everyday

## 2017-10-12 LAB — PROTIME-INR
INR: 2 — AB (ref 0.8–1.2)
PROTHROMBIN TIME: 19.6 s — AB (ref 9.1–12.0)

## 2017-10-21 ENCOUNTER — Ambulatory Visit: Payer: Self-pay | Admitting: Internal Medicine

## 2017-10-21 ENCOUNTER — Other Ambulatory Visit: Payer: Self-pay

## 2017-10-21 DIAGNOSIS — Z952 Presence of prosthetic heart valve: Secondary | ICD-10-CM

## 2017-10-21 NOTE — Progress Notes (Signed)
Patient on coumadin 5 mg 1 daily. Patient states he is eating greens a few times a week but not everyday.

## 2017-10-22 LAB — PROTIME-INR
INR: 3.7 — AB (ref 0.8–1.2)
Prothrombin Time: 35.3 s — ABNORMAL HIGH (ref 9.1–12.0)

## 2017-11-04 ENCOUNTER — Other Ambulatory Visit: Payer: Self-pay

## 2017-11-04 DIAGNOSIS — Z952 Presence of prosthetic heart valve: Secondary | ICD-10-CM

## 2017-11-04 NOTE — Progress Notes (Signed)
Patient states he is taking coumadin 5 mg daily. States he is eating his greens consistently.

## 2017-11-05 LAB — PROTIME-INR
INR: 1.6 — AB (ref 0.8–1.2)
Prothrombin Time: 16.6 s — ABNORMAL HIGH (ref 9.1–12.0)

## 2017-11-18 ENCOUNTER — Other Ambulatory Visit: Payer: Self-pay

## 2017-11-18 DIAGNOSIS — Z952 Presence of prosthetic heart valve: Secondary | ICD-10-CM

## 2017-11-18 NOTE — Progress Notes (Signed)
Patient states he is taking coumadin 5 mg 1 daily. Patient states he is greens sometimes

## 2017-11-19 LAB — PROTIME-INR
INR: 4.6 — AB (ref 0.8–1.2)
PROTHROMBIN TIME: 43.9 s — AB (ref 9.1–12.0)

## 2017-11-24 ENCOUNTER — Other Ambulatory Visit: Payer: Self-pay | Admitting: Internal Medicine

## 2017-11-25 ENCOUNTER — Other Ambulatory Visit: Payer: Self-pay

## 2017-11-25 ENCOUNTER — Other Ambulatory Visit: Payer: Self-pay | Admitting: Internal Medicine

## 2017-11-25 DIAGNOSIS — Z952 Presence of prosthetic heart valve: Secondary | ICD-10-CM

## 2017-11-25 NOTE — Progress Notes (Signed)
Patient states he is taking coumadin 5 mg 1 daily. Patient states he is eating greens a few times a week.

## 2017-11-26 LAB — PROTIME-INR
INR: 3.6 — ABNORMAL HIGH (ref 0.8–1.2)
PROTHROMBIN TIME: 34.6 s — AB (ref 9.1–12.0)

## 2017-12-03 ENCOUNTER — Ambulatory Visit (INDEPENDENT_AMBULATORY_CARE_PROVIDER_SITE_OTHER): Payer: Self-pay | Admitting: Internal Medicine

## 2017-12-03 ENCOUNTER — Encounter: Payer: Self-pay | Admitting: Internal Medicine

## 2017-12-03 VITALS — BP 122/78 | HR 96 | Resp 12 | Ht 63.5 in | Wt 137.0 lb

## 2017-12-03 DIAGNOSIS — Z952 Presence of prosthetic heart valve: Secondary | ICD-10-CM

## 2017-12-03 NOTE — Progress Notes (Signed)
   Subjective:    Patient ID: Ricky James, male    DOB: [birth year], 22 y.o.   MRN: [id number]  HPI   Here today as unable to get his INR at a good range consistently.  Below 2.5, then 2 weeks later above 3.5.  Problem since at least beginning of January. He is basically living in [place]. 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 He recently moved in with his girlfriend. Both his mother and girlfriend cook.  He is mainly bringing food from home. Taking Warfarin at different times during the day due to work schedule.  Works every day but Monday. 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   Sundays, 11 am to 5 pm.   Gets up at 7 am almost every morning, maybe 9 am on Sunday Skips breakfast most days.  Is 10 minute drive from work.   Only eats lunch and dinner. Warfarin taking 5 mg every day.   Current Meds  Medication Sig  . lisinopril (PRINIVIL,ZESTRIL) 5 MG tablet 1/2 tab by mouth daily  . warfarin (COUMADIN) 5 MG tablet TAKE ONE TABLET BY MOUTH ONCE DAILY MONDAY THROUGH FRIDAY    No Known Allergies     Review of Systems     Objective:   Physical Exam NAD Lungs:  CTA CV:  RRR with mechanical valve noise LSB/axillary line.  Radial pulses normal and equal Abd:  S, NT, No HSM or mass, + BS LE:  No edema       Assessment & Plan:  Mechanical aortic and mitral valves.  Difficulty with compliance with medications.   Urged to take warfarin in the morning about the same time each day when his day is more set in routine. Discussed with him that he should look [place] cliniic [place] . Long discussion about consistency of greens in diet. He voices understaning. Repeat INR in 1 week.

## 2017-12-04 LAB — PROTIME-INR
INR: 5.6 (ref 0.8–1.2)
PROTHROMBIN TIME: 53 s — AB (ref 9.1–12.0)

## 2017-12-10 ENCOUNTER — Other Ambulatory Visit: Payer: Self-pay

## 2017-12-10 DIAGNOSIS — Z952 Presence of prosthetic heart valve: Secondary | ICD-10-CM

## 2017-12-10 NOTE — Progress Notes (Signed)
Patient states he held his coumadin on Wednesday and took 2.5 mg on Thursday and then went back to 5 mg on Friday and has been on 5 mg since Friday.

## 2017-12-11 LAB — PROTIME-INR
INR: 2.1 — ABNORMAL HIGH (ref 0.8–1.2)
Prothrombin Time: 21 s — ABNORMAL HIGH (ref 9.1–12.0)

## 2017-12-22 ENCOUNTER — Encounter: Payer: Self-pay | Admitting: Internal Medicine

## 2017-12-24 ENCOUNTER — Other Ambulatory Visit: Payer: Self-pay

## 2017-12-24 DIAGNOSIS — Z952 Presence of prosthetic heart valve: Secondary | ICD-10-CM

## 2017-12-24 NOTE — Progress Notes (Signed)
Patient taking coumadin 5 mg daily and states he is eating green on consistent basis. Patient states he takes his coumadin every morning at 9 am.

## 2017-12-25 LAB — PROTIME-INR
INR: 4.1 — AB (ref 0.8–1.2)
Prothrombin Time: 39 s — ABNORMAL HIGH (ref 9.1–12.0)

## 2018-01-07 ENCOUNTER — Other Ambulatory Visit: Payer: Self-pay

## 2018-01-07 DIAGNOSIS — Z952 Presence of prosthetic heart valve: Secondary | ICD-10-CM

## 2018-01-07 NOTE — Progress Notes (Signed)
Patient states he is taking 2.5 mg on Thursday and 5 mg on all other days.

## 2018-01-08 LAB — PROTIME-INR
INR: 5.3 (ref 0.8–1.2)
Prothrombin Time: 49.9 s — ABNORMAL HIGH (ref 9.1–12.0)

## 2018-01-13 ENCOUNTER — Other Ambulatory Visit: Payer: Self-pay

## 2018-01-13 DIAGNOSIS — Z952 Presence of prosthetic heart valve: Secondary | ICD-10-CM

## 2018-01-13 NOTE — Progress Notes (Signed)
Patient states he held his coumadin on Thursday since he had taken it on Wednesday and Friday he took 2.5 mg and 5 mg on sat sun and today. Patient states he has been eating greens consistently as well.

## 2018-01-14 LAB — PROTIME-INR
INR: 2.4 — AB (ref 0.8–1.2)
PROTHROMBIN TIME: 23.9 s — AB (ref 9.1–12.0)

## 2018-01-22 ENCOUNTER — Other Ambulatory Visit: Payer: Self-pay

## 2018-01-28 ENCOUNTER — Other Ambulatory Visit: Payer: Self-pay

## 2018-01-28 DIAGNOSIS — Z952 Presence of prosthetic heart valve: Secondary | ICD-10-CM

## 2018-01-28 NOTE — Progress Notes (Signed)
Patient states he is taking 2.5 mg on fridays and 5 mg all other days. Patient states he is eating his greens consistently

## 2018-01-29 LAB — PROTIME-INR
INR: 5 (ref 0.8–1.2)
PROTHROMBIN TIME: 47.3 s — AB (ref 9.1–12.0)

## 2018-02-17 ENCOUNTER — Other Ambulatory Visit: Payer: Self-pay

## 2018-02-17 DIAGNOSIS — Z952 Presence of prosthetic heart valve: Secondary | ICD-10-CM

## 2018-02-18 LAB — PROTIME-INR
INR: 4 — AB (ref 0.8–1.2)
PROTHROMBIN TIME: 38 s — AB (ref 9.1–12.0)

## 2018-03-04 ENCOUNTER — Other Ambulatory Visit: Payer: Self-pay

## 2018-03-04 DIAGNOSIS — Z952 Presence of prosthetic heart valve: Secondary | ICD-10-CM

## 2018-03-04 NOTE — Progress Notes (Signed)
Patient taking coumadin 5 mg sun- thurs and 2.5 mg on Friday and saturday

## 2018-03-05 LAB — PROTIME-INR
INR: 3.9 — AB (ref 0.8–1.2)
Prothrombin Time: 37.4 s — ABNORMAL HIGH (ref 9.1–12.0)
